# Patient Record
Sex: Female | Born: 1955 | Hispanic: No | Marital: Married
Health system: Western US, Academic
[De-identification: ages and names within clinical notes are randomized; demographics above are authoritative.]

## PROBLEM LIST (undated history)

## (undated) DIAGNOSIS — J310 Chronic rhinitis: Secondary | ICD-10-CM

## (undated) DIAGNOSIS — J329 Chronic sinusitis, unspecified: Secondary | ICD-10-CM

## (undated) DIAGNOSIS — J45909 Unspecified asthma, uncomplicated: Secondary | ICD-10-CM

## (undated) DIAGNOSIS — Z8709 Personal history of other diseases of the respiratory system: Secondary | ICD-10-CM

## (undated) HISTORY — PX: CATARACT EXTRACTION: SHX1313B

## (undated) HISTORY — DX: Chronic rhinitis: J31.0

## (undated) HISTORY — DX: Chronic sinusitis, unspecified: J32.9

## (undated) HISTORY — DX: Personal history of other diseases of the respiratory system: Z87.09

## (undated) HISTORY — DX: Unspecified asthma, uncomplicated: J45.909

---

## 2000-12-15 ENCOUNTER — Emergency Department (HOSPITAL_COMMUNITY): Admission: EM | Admit: 2000-12-15 | Discharge: 2000-12-15 | Payer: Self-pay | Admitting: *Deleted

## 2001-01-05 ENCOUNTER — Emergency Department (HOSPITAL_COMMUNITY): Admission: EM | Admit: 2001-01-05 | Discharge: 2001-01-05 | Payer: Self-pay | Admitting: Emergency Medicine

## 2002-08-20 ENCOUNTER — Ambulatory Visit (HOSPITAL_COMMUNITY): Admission: RE | Admit: 2002-08-20 | Discharge: 2002-08-20 | Payer: Self-pay | Admitting: Family Medicine

## 2002-08-20 ENCOUNTER — Encounter: Payer: Self-pay | Admitting: Family Medicine

## 2002-09-12 ENCOUNTER — Encounter: Payer: Self-pay | Admitting: Emergency Medicine

## 2002-09-12 ENCOUNTER — Inpatient Hospital Stay (HOSPITAL_COMMUNITY): Admission: EM | Admit: 2002-09-12 | Discharge: 2002-09-18 | Payer: Self-pay | Admitting: Emergency Medicine

## 2002-09-13 ENCOUNTER — Encounter: Payer: Self-pay | Admitting: Emergency Medicine

## 2002-09-13 ENCOUNTER — Encounter: Payer: Self-pay | Admitting: Critical Care Medicine

## 2002-09-14 ENCOUNTER — Encounter: Payer: Self-pay | Admitting: Critical Care Medicine

## 2002-09-15 ENCOUNTER — Encounter: Payer: Self-pay | Admitting: Critical Care Medicine

## 2002-09-16 ENCOUNTER — Encounter: Payer: Self-pay | Admitting: Critical Care Medicine

## 2004-10-04 ENCOUNTER — Encounter: Admission: RE | Admit: 2004-10-04 | Discharge: 2004-10-04 | Payer: Self-pay | Admitting: Specialist

## 2005-02-08 ENCOUNTER — Ambulatory Visit: Payer: Self-pay | Admitting: Internal Medicine

## 2005-07-03 ENCOUNTER — Ambulatory Visit: Payer: Self-pay | Admitting: Internal Medicine

## 2005-12-12 ENCOUNTER — Ambulatory Visit: Payer: Self-pay | Admitting: Internal Medicine

## 2006-05-07 ENCOUNTER — Ambulatory Visit: Payer: Self-pay | Admitting: Internal Medicine

## 2006-08-08 ENCOUNTER — Ambulatory Visit: Payer: Self-pay | Admitting: Internal Medicine

## 2006-09-19 ENCOUNTER — Ambulatory Visit: Payer: Self-pay | Admitting: Internal Medicine

## 2007-03-13 ENCOUNTER — Ambulatory Visit: Payer: Self-pay | Admitting: Internal Medicine

## 2008-02-14 DIAGNOSIS — J4541 Moderate persistent asthma with (acute) exacerbation: Secondary | ICD-10-CM | POA: Insufficient documentation

## 2008-02-14 DIAGNOSIS — J302 Other seasonal allergic rhinitis: Secondary | ICD-10-CM

## 2008-02-14 DIAGNOSIS — J3089 Other allergic rhinitis: Secondary | ICD-10-CM

## 2008-02-17 ENCOUNTER — Ambulatory Visit: Payer: Self-pay | Admitting: Internal Medicine

## 2008-02-23 DIAGNOSIS — J45909 Unspecified asthma, uncomplicated: Secondary | ICD-10-CM

## 2009-01-12 ENCOUNTER — Ambulatory Visit: Payer: Self-pay | Admitting: Internal Medicine

## 2009-01-14 ENCOUNTER — Encounter: Payer: Self-pay | Admitting: Internal Medicine

## 2009-01-14 ENCOUNTER — Telehealth (INDEPENDENT_AMBULATORY_CARE_PROVIDER_SITE_OTHER): Payer: Self-pay | Admitting: *Deleted

## 2009-01-31 DIAGNOSIS — J9600 Acute respiratory failure, unspecified whether with hypoxia or hypercapnia: Secondary | ICD-10-CM

## 2009-08-24 ENCOUNTER — Ambulatory Visit: Payer: Self-pay | Admitting: Internal Medicine

## 2009-08-24 DIAGNOSIS — R21 Rash and other nonspecific skin eruption: Secondary | ICD-10-CM | POA: Insufficient documentation

## 2010-11-29 ENCOUNTER — Telehealth: Payer: Self-pay | Admitting: Internal Medicine

## 2010-12-22 ENCOUNTER — Ambulatory Visit (INDEPENDENT_AMBULATORY_CARE_PROVIDER_SITE_OTHER): Payer: BC Managed Care – HMO | Admitting: Internal Medicine

## 2010-12-22 ENCOUNTER — Ambulatory Visit: Admit: 2010-12-22 | Payer: Self-pay | Admitting: Internal Medicine

## 2010-12-22 ENCOUNTER — Encounter: Payer: Self-pay | Admitting: Internal Medicine

## 2010-12-22 DIAGNOSIS — J018 Other acute sinusitis: Secondary | ICD-10-CM

## 2010-12-22 DIAGNOSIS — J9600 Acute respiratory failure, unspecified whether with hypoxia or hypercapnia: Secondary | ICD-10-CM

## 2010-12-22 DIAGNOSIS — J45909 Unspecified asthma, uncomplicated: Secondary | ICD-10-CM

## 2010-12-22 DIAGNOSIS — J309 Allergic rhinitis, unspecified: Secondary | ICD-10-CM

## 2010-12-22 NOTE — Progress Notes (Signed)
Summary: refills  Phone Note From Pharmacy   Caller: [facility] Summary of Call: Pt was at pharmacy stating she was not able to get refills on medication until pharm called our office. Per our records pt needed to call for appointment for refills. Pt is scheduled to see CY Feb 2nd and will need to keep that appointment for additional refills. 1 month supply given on both. Zackery Barefoot CMA  November 29, 2010 12:28 PM     Prescriptions: VENTOLIN HFA 108 (90 BASE) MCG/ACT AERS (ALBUTEROL SULFATE) 2 puffs four times a day as needed  #1 x 0   Entered by:   Zackery Barefoot CMA   Authorized by:   Waymon Budge MD   Signed by:   Zackery Barefoot CMA on 11/29/2010   Method used:   Telephoned to ...       [facility] (retail)       [address]       [place], [place]  [id number]       Ph: [phone]       Fax: [phone]   RxID:   [id number] ADVAIR DISKUS 500-50 MCG/DOSE  MISC (FLUTICASONE-SALMETEROL) take one puff two times a day   rinse mouth after each use  #60 Each x 0   Entered by:   Zackery Barefoot CMA   Authorized by:   Waymon Budge MD   Signed by:   Zackery Barefoot CMA on 11/29/2010   Method used:   Telephoned to ...       [facility] (retail)       [address]       [place], [place]  [id number]       Ph: [phone]       Fax: [phone]   RxID:   [id number]

## 2010-12-28 NOTE — Assessment & Plan Note (Signed)
Summary: rov   Vital Signs:  Patient profile:   55 year old female Height:      64 inches Weight:      152.25 pounds BMI:     26.23 O2 Sat:      97 % on Room air Pulse rate:   87 / minute BP sitting:   120 / 80  (left arm) Cuff size:   regular  Vitals Entered By: Reynaldo Minium CMA (December 22, 2010 2:54 PM)  O2 Flow:  Room air CC: Follow up visit-stopped up nose(worse at night).   Primary Provider/Referring Provider:  None  CC:  Follow up visit-stopped up nose(worse at night)..  History of Present Illness: 02/17/08- asthma, rhinitis She notices a runny nose this spring, but the most important issue is that she is about to leave to visit her family in [place] for 4 months.  She wants to take medications with her and we discussed this.  She thinks her current medications are adequate.  Benadryl makes her sleepy.  She is not now, wheezing, or uncomfortable.   01/12/09- asthmatic bronchitis, rhinitis Was back in [place] for 3 months, returning in September. Was well through winter. Just in last few days has questioned rhinorhea and some fever. Feels for 2 months Advair 500 is not working quite as well as it had. Feels cough and malaise, headache, sinus pressure and congestion.  August 24, 2009- Asthmatic bronchitis, rhinitis, eczema Never bad, but sometimes chest gets tight. Little cough or wheeze.Needs flu shot.  Has been using Advsair 500, with only occasional need for rescue inhaler. Asks refill for med we gave for eczema.  December 22, 2010-  Asthmatic bronchitis, rhinitis, eczema Nurse-CC: Follow up visit-stopped up nose(worse at night). She had gone to [place] and [place], back now 6 months. Having sinus congestion and occasional chest tightness. I can't understand if she really felt any different while in [place]- she didn't seem to notice a difference. Nasal congestion with some mucoid discharge, pressure but not headache, fever, blood, sore throat or ear ache.     Asthma  History    Initial Asthma Severity Rating:    Age range: 12+ years    Symptoms: 0-2 days/week    Nighttime Awakenings: 0-2/month    Interferes w/ normal activity: no limitations    SABA use (not for EIB): 0-2 days/week    Asthma Severity Assessment: Intermittent   Preventive Screening-Counseling & Management  Alcohol-Tobacco     Smoking Status: never  Current Medications (verified): 1)  Advair Diskus 500-50 Mcg/dose  Misc (Fluticasone-Salmeterol) .... Take One Puff Two Times A Day   Rinse Mouth After Each Use 2)  Ventolin Hfa 108 (90 Base) Mcg/act Aers (Albuterol Sulfate) .... 2 Puffs Four Times A Day As Needed  Allergies (verified): No Known Drug Allergies  Past History:  Past Surgical History: Last updated: 01/12/2009 None  Family History: Last updated: 08/24/2009 Unknown- [place]  Social History: Last updated: 01/12/2009 From [place] Patient never smoked.  Married 3 children  Risk Factors: Smoking Status: never (12/22/2010)  Past Medical History: Allergic Rhinitis Rhinosinusitis Asthma Hx of acute respiratory failure/ severe asthma  Review of Systems      See HPI       The patient complains of nasal congestion/difficulty breathing through nose and sneezing.  The patient denies shortness of breath with activity, shortness of breath at rest, productive cough, non-productive cough, coughing up blood, chest pain, irregular heartbeats, acid heartburn, indigestion, loss of appetite, weight change,  abdominal pain, difficulty swallowing, sore throat, tooth/dental problems, and headaches.    Physical Exam  Additional Exam:  General: A/Ox3; pleasant and cooperative, NAD, SKIN: no visble rash, little exposed skin NODES: no lymphadenopathy HEENT: [place]/AT, EOM- WNL, Conjuctivae- clear, PERRLA, TM-WNL, Nose- stuffy with turbinate edema, no polyps or purulence, Throat- clear and wnl, Mallampati  III NECK: Supple w/ fair ROM, JVD- none, normal carotid impulses w/o bruits  Thyroid- CHEST:Clear to P&A, no wheeze HEART: RRR, no m/g/r heard ABDOMEN: Soft and nl;  NWG:NFAO, nl pulses, no edema  NEURO: Grossly intact to observation      Impression & Recommendations:  Problem # 1:  RHINOSINUSITIS, ACUTE (ICD-461.8)  Her main complaint today is nasal congestion, apparently persistent for over a month We will give neb, depo and amox, then suggest PE.   Her updated medication list for this problem includes:    Amoxicillin 500 Mg Tabs (Amoxicillin) .Marland Kitchen... 1 three times a day x 10 days  Problem # 2:  ASTHMA (ICD-493.90) Asthma has been under good control, but she needs meds refilled. There have been no dangerous episodes since her original respiratory evetrn.   Medications Added to Medication List This Visit: 1)  Amoxicillin 500 Mg Tabs (Amoxicillin) .Marland Kitchen.. 1 three times a day x 10 days  Other Orders: Admin of Therapeutic Inj  intramuscular or subcutaneous (13086) Depo- Medrol 80mg  (J1040) Nebulizer Tx (57846) Est. Patient Level III (96295)  Patient Instructions: 1)  Please schedule a follow-up appointment in 1 year or sooner as needed 2)  Neb neo nasal 3)  depo 80 4)  script for amoxacillin antibiotic for infection in sinuses 5)  Script refills for Advair and Ventolin for asthma 6)  For nasal / sinus congestion, you can try Suphedrine-PE otc  Prescriptions: VENTOLIN HFA 108 (90 BASE) MCG/ACT AERS (ALBUTEROL SULFATE) 2 puffs four times a day as needed  #1 x prn   Entered and Authorized by:   Waymon Budge MD   Signed by:   Waymon Budge MD on 12/22/2010   Method used:   Print then Give to Patient   RxID:   [id number] ADVAIR DISKUS 500-50 MCG/DOSE  MISC (FLUTICASONE-SALMETEROL) take one puff two times a day   rinse mouth after each use  #60 Each x prn   Entered and Authorized by:   Waymon Budge MD   Signed by:   Waymon Budge MD on 12/22/2010   Method used:   Print then Give to Patient   RxID:   [id number] AMOXICILLIN 500 MG TABS  (AMOXICILLIN) 1 three times a day x 10 days  #30 x 0   Entered and Authorized by:   Waymon Budge MD   Signed by:   Waymon Budge MD on 12/22/2010   Method used:   Print then Give to Patient   RxID:   [id number]    Medication Administration  Injection # 1:    Medication: Depo- Medrol 80mg     Diagnosis: RHINOSINUSITIS, ACUTE (ICD-461.8)    Route: SQ    Site: RUOQ gluteus    Exp Date: 05/2013    Lot #: [id number]    Mfr: Pharmacia    Patient tolerated injection without complications    Given by: Reynaldo Minium CMA (December 22, 2010 3:48 PM)  Medication # 1:    Medication: EMR miscellaneous medications    Diagnosis: RHINOSINUSITIS, ACUTE (ICD-461.8)    Dose: 3drops    Route: intranasal    Exp Date: 02/2012  Lot #: [id number]    Mfr: bayer    Comments: Neo-synephrine    Patient tolerated medication without complications    Given by: Reynaldo Minium CMA (December 22, 2010 3:48 PM)  Orders Added: 1)  Admin of Therapeutic Inj  intramuscular or subcutaneous [96372] 2)  Depo- Medrol 80mg  [J1040] 3)  Nebulizer Tx [78469] 4)  Est. Patient Level III [62952]

## 2011-04-07 NOTE — Assessment & Plan Note (Signed)
[facility]                               PULMONARY OFFICE NOTE   NAME:Brandy Rose, Rose                          MRN:          [id number]  DATE:09/19/2006                            DOB:          [birth year]    PROBLEM LIST:  1. Asthmatic bronchitis.  2. Allergic rhinitis.  3. History of intubation for respiratory failure.   HISTORY:  She says this is a good day and that she feels well.  Singulair  caused headache and she stopped it.   MEDICATIONS:  1. Advair 500/50.  2. Albuterol rescue inhaler used occasionally.   NO MEDICATION ALLERGY.   OBJECTIVE:  VITAL SIGNS:  Weight 149 pounds, BP 126/62, pulse regular 89,  room air saturation 98%.  GENERAL:  She sounds slightly nasal.  CHEST:  Quiet and clear.  HEART:  Sounds are regular without murmur.  There is no adenopathy.  No  cyanosis or edema.   PFT:  Spirometry shows severe obstructive airway disease with an FEV1 of  1.26 (56% predicted), FVC 75% of predicted, ratio 0.56, small airway flows  respond some to bronchodilator.  Chest x-ray, on August 08, 2006, had  questioned a small nodule at the left lung base suggesting followup chest x-  ray.  An immunoglobulin E was 30.9 on August 09, 2006, with 7.6%  eosinophils on her differential.   IMPRESSION:  Chronic asthma/obstructive airway disease with probable ongoing  allergic component.   PLAN:  1. Chest x-ray to followup questionable lung nodule.  2. Flu vaccine discussed and given.  3. Schedule return 4 months, earlier p.r.n.  4. Environmental precautions were discussed.    Clinton D. Maple Hudson, MD, Tonny Bollman, FACP  Electronically Signed   CDY/MedQ  DD: 09/21/2006  DT: 09/22/2006  Job #: [id number]   cc:   [facility]

## 2011-04-07 NOTE — Discharge Summary (Signed)
Brandy Rose, Brandy Rose                             ACCOUNT NO.:  [id number]   MEDICAL RECORD NO.:  [id number]                   PATIENT TYPE:  INP   LOCATION:  5530                                 FACILITY:  [facility]   PHYSICIAN:  Oley Balm. Sung Amabile, M.D. [facility]          DATE OF BIRTH:  25-Mar-1956   DATE OF ADMISSION:  09/12/2002  DATE OF DISCHARGE:  09/18/2002                                 DISCHARGE SUMMARY   ADMISSION DIAGNOSES:  1. Ventilator-dependent respiratory failure.  2. Status asthmaticus.   DISCHARGE DIAGNOSES:  1. Ventilator-dependent respiratory failure, resolved.  2. Status asthmaticus, resolved.  3. Poorly controlled asthma due to lack of access to medical care.  The     patient was not on a controller medication prior to admission; in     addition, she has significant exposure to dust and cleaning supplies at     her place of employment.   HISTORY OF PRESENT ILLNESS:  This is a 55 year old woman who recently moved  to the [place] from [place].  She presented with one to two days of  increased shortness of breath and wheezing.  She was using albuterol almost  hourly without improvement in her symptoms.  Upon arrival to the emergency  department, she was noted to be in respiratory distress.  She underwent  intubation in the emergency department and was transferred to the intensive  care unit.   HOSPITAL COURSE:  She was treated in the usual fashion with nebulized  bronchodilators and intravenous corticosteroids; in addition, she was  treated with paralytics, magnesium and ceftriaxone.  Records indicate that  this was a very difficult intubation and she was intubated with a #6 tube.  With the above intervention, she improved dramatically and by September 15, 2002, she was felt ready for extubation.  Due to the difficulty of  intubation, we had anesthesia on standby during the extubation process.  She  did have some mild stridor and pseudo-wheeze immediately post  extubation but  never had respiratory distress.  Antibiotics were stopped on September 08, 2002 in the absence of any evidence of an infectious process.  Steroids were  tapered beginning on September 15, 2002.  She was transferred out of the  intensive care unit on September 16, 2002 and the rest of her hospitalization  was uncomplicated.  She did undergo asthma teaching and was taught how to  use the Advair Diskus and metered-dose inhalers properly.  She is discharged  now in markedly improved condition.   DISCHARGE MEDICATIONS:  1. Advair 250/50 mcg one inhalation q.12h.  2. Albuterol metered-dose inhaler two sprays q.4h. p.r.n.  3. Prednisone 60 mg x3, 40 mg x3, 20 mg x3, stop.    FOLLOWUP:  She is scheduled to see Dr. Onalee Hua B. Simonds in the office on  October 22, 2002 at 9 a.m.  It is also of note that she is followed  at  [facility] for her general medical care.                                               Oley Balm Sung Amabile, M.D. [facility]    DBS/MEDQ  D:  09/18/2002  T:  09/19/2002  Job:  [id number]   cc:   Dala Dock

## 2011-04-07 NOTE — Assessment & Plan Note (Signed)
[facility]                             PULMONARY OFFICE NOTE   NAME:Brandy Rose, Brandy Rose                          MRN:          [id number]  DATE:03/13/2007                            DOB:          Jul 11, 1956    HISTORY OF PRESENT ILLNESS:  The patient is a 55 year old female patient  of Dr. Maple Hudson who has a known history of chronic asthmatic bronchitis and  allergic rhinitis who presents today for a 2-day history of congestion,  wheezing, and tightness.  The patient reports that she had to increase  her albuterol use over the last 24 hours.  The patient denies any  purulent sputum, fever, chest pain, palpitations, neck or arm pain, or  radiating pains.  The patient is taking Advair 500/50 twice daily.   PAST MEDICAL HISTORY:  Reviewed.   CURRENT MEDICATIONS:  Reviewed.   PHYSICAL EXAM:  The patient is a pleasant female in no acute distress.  She is afebrile with stable vital signs.  O2 saturation is 97% on room  air.  HEENT:  Unremarkable.  NECK:  Supple without adenopathy.  No JVD.  LUNGS:  Sounds are clear without any wheezing or crackles.  CARDIAC:  Regular rate and rhythm.  ABDOMEN:  Soft and nontender.  EXTREMITIES:  Warm without any calf cyanosis, clubbing, or edema.   IMPRESSION AND PLAN:  Mild asthmatic bronchitic flare.  The patient is  given a Xopenex nebulizer treatment in the office.  The patient is  recommended to add in Mucinex DM twice daily and claritin 10 mg daily.  The patient was offered a prednisone taper to have on hold.  However,  the patient reports that steroids make her very jittery and we will hold  off at this time.  The patient is advised if symptoms do not improve or  do worsen, she is to call us immediately for followup.      Rubye Oaks, NP  Electronically Signed      Clinton D. Maple Hudson, MD, Tonny Bollman, FACP  Electronically Signed   TP/MedQ  DD: 03/13/2007  DT: 03/13/2007  Job #: [id number]

## 2011-04-07 NOTE — Assessment & Plan Note (Signed)
[facility]                               PULMONARY OFFICE NOTE   NAME:Brandy Rose, Brandy Rose                          MRN:          [id number]  DATE:08/08/2006                            DOB:          12/07/1955    PROBLEM:  1. Asthmatic bronchitis.  2. Allergic rhinitis.  3. History of intubation for respiratory failure.   HISTORY:  She says cold air has made her tighter, but she does not feel  sick.  I tried to help her with her English a little bit, discussing what we  mean by a cold.  She denies fever, sore throat or discolored sputum, but  has been fairly tight for the last several days  Theophylline was tried  twice and does not seem to have helped any.  Singulair did not help.  She  continues Advair 500/50 and I believe uses it appropriately.  She was skin-  test-positive in the past, but did not stay on allergy vaccine long enough  to be helpful in 2004.   OBJECTIVE:  Weight 149 pounds.  BP 116/70.  Pulse regular at 79.  Room air  saturation 96%.  She is unlabored, but there is distinct bilateral inspiratory and expiratory  wheeze with no dullness or rhonchi, no accessory muscle use.  Heart sounds are regular without murmur or gallop.  There is no neck vein distention, peripheral edema, cyanosis or adenopathy.   IMPRESSION:  Exacerbation of chronic asthma.  Finances and language  interfere a little, but we seem to be able to get around the language  barrier.  I wonder if she might be a candidate for Xolair.   PLAN:  Blood today for CBC with differential and IgE level.  Chest x-ray.  Schedule spirometry before and after bronchodilator.  Leave of absence for 3  days, September 20, 21 and 22.  Prednisone 8-day taper from 40 mg with  steroid talk again done, questions answered.  Scheduled return in 6 weeks.                                   Clinton D. Maple Hudson, MD, FCCP, FACP   CDY/MedQ  DD:  08/08/2006  DT:  08/09/2006  Job #:  [id number]   cc:    [employer]

## 2011-04-14 ENCOUNTER — Other Ambulatory Visit: Payer: Self-pay | Admitting: Internal Medicine

## 2011-04-19 ENCOUNTER — Telehealth: Payer: Self-pay | Admitting: Internal Medicine

## 2011-04-19 MED ORDER — FLUTICASONE-SALMETEROL 500-50 MCG/DOSE IN AEPB: 1.0000 | INHALATION_SPRAY | Freq: Two times a day (BID) | RESPIRATORY_TRACT | Status: DC

## 2011-04-19 NOTE — Telephone Encounter (Signed)
Refill sent. Pt aware.Brandy Rose, CMA  

## 2011-04-20 ENCOUNTER — Telehealth: Payer: Self-pay | Admitting: Internal Medicine

## 2011-04-20 NOTE — Telephone Encounter (Signed)
Per pt's records, this was already called in yesterday to the [facility] per pt's request.  Now she is requesting it be called into [facility]?!?!?!  ATC and speak with pt.  Family member stated she was not avail and was unable to take a message as she spoke very little [place]. Therefore WCB.

## 2011-04-21 NOTE — Telephone Encounter (Signed)
ATC pt at home #.  Line busy x 3.  WCB 

## 2011-04-25 NOTE — Telephone Encounter (Signed)
LMTCB

## 2011-04-26 NOTE — Telephone Encounter (Signed)
ATC pt at home # x 3.  Line busy.  WCB

## 2011-04-27 NOTE — Telephone Encounter (Signed)
Spoke with lady at home number-states pt not home and will have her call back in the morning.

## 2011-04-28 NOTE — Telephone Encounter (Signed)
ATC pt at home #.  NA and unable to leave a message. WCB

## 2011-05-01 NOTE — Telephone Encounter (Signed)
Spoke with pt and she states has already picked up her advair and nothing further needed.

## 2011-11-10 ENCOUNTER — Other Ambulatory Visit: Payer: Self-pay | Admitting: Internal Medicine

## 2011-12-25 ENCOUNTER — Encounter: Payer: Self-pay | Admitting: Pulmonary Disease

## 2011-12-26 ENCOUNTER — Ambulatory Visit: Payer: BC Managed Care – HMO | Admitting: Internal Medicine

## 2012-01-18 ENCOUNTER — Telehealth: Payer: Self-pay | Admitting: Internal Medicine

## 2012-01-18 NOTE — Telephone Encounter (Signed)
Form and message on CY's cart to complete.

## 2012-01-18 NOTE — Telephone Encounter (Signed)
NOTE: I HAVE GIVEN THIS TO KATIE. Brandy Rose

## 2012-01-19 ENCOUNTER — Encounter: Payer: Self-pay | Admitting: Internal Medicine

## 2012-01-22 ENCOUNTER — Encounter: Payer: Self-pay | Admitting: Internal Medicine

## 2012-01-22 ENCOUNTER — Ambulatory Visit (INDEPENDENT_AMBULATORY_CARE_PROVIDER_SITE_OTHER): Payer: BC Managed Care – HMO | Admitting: Internal Medicine

## 2012-01-22 VITALS — BP 120/90 | HR 90 | Ht 64.0 in | Wt 152.6 lb

## 2012-01-22 DIAGNOSIS — J45909 Unspecified asthma, uncomplicated: Secondary | ICD-10-CM

## 2012-01-22 DIAGNOSIS — J309 Allergic rhinitis, unspecified: Secondary | ICD-10-CM

## 2012-01-22 DIAGNOSIS — J9600 Acute respiratory failure, unspecified whether with hypoxia or hypercapnia: Secondary | ICD-10-CM

## 2012-01-22 MED ORDER — ALBUTEROL SULFATE HFA 108 (90 BASE) MCG/ACT IN AERS: 2.0000 | INHALATION_SPRAY | RESPIRATORY_TRACT | Status: DC | PRN

## 2012-01-22 MED ORDER — FLUTICASONE-SALMETEROL 250-50 MCG/DOSE IN AEPB: 1.0000 | INHALATION_SPRAY | Freq: Two times a day (BID) | RESPIRATORY_TRACT | Status: DC

## 2012-01-22 NOTE — Progress Notes (Signed)
01/22/12- 56 yoF from [place], followed for asthmatic bronchitis, allergic rhinitis. History of ventilator dependent respiratory failure. LOV- 12/22/2010 Was in [place] last month. Here now with youngest daughter. Reports occasional mild wheezing, easily controlled and mostly associated with colds. Occasional rhinitis is also better controlled. Has been using Benadryl as antihistamine of choice but it does cause sleepiness.  ROS-see HPI Constitutional:   No-   weight loss, night sweats, fevers, chills, fatigue, lassitude. HEENT:   No-  headaches, difficulty swallowing, tooth/dental problems, sore throat,       No-  sneezing, itching, ear ache, nasal congestion, post nasal drip,  CV:  No-   chest pain, orthopnea, PND, swelling in lower extremities, anasarca, dizziness, palpitations Resp: No-   shortness of breath with exertion or at rest.              No-   productive cough,  No non-productive cough,  No- coughing up of blood.              No-   change in color of mucus.  No- wheezing.   Skin: No-   rash or lesions. GI:  No-   heartburn, indigestion, abdominal pain, nausea,  GU:. MS:  No-   joint pain or swelling.  No- decreased range of motion.  No- back pain. Neuro-     nothing unusual Psych:  No- change in mood or affect. No depression or anxiety.  No memory loss.  OBJ- Physical Exam General- Alert, Oriented, Affect-appropriate, Distress- none acute Skin- rash-none, lesions- none, excoriation- none Lymphadenopathy- none Head- atraumatic            Eyes- Gross vision intact, PERRLA, conjunctivae and secretions clear            Ears- Hearing, canals-normal            Nose- Clear, no-Septal dev, mucus, polyps, erosion, perforation             Throat- Mallampati II , mucosa clear , drainage- none, tonsils- atrophic Neck- flexible , trachea midline, no stridor , thyroid nl, carotid no bruit Chest - symmetrical excursion , unlabored           Heart/CV- RRR , no murmur , no gallop  , no rub, nl s1  s2                           - JVD- none , edema- none, stasis changes- none, varices- none           Lung- clear to P&A, wheeze- none, cough- none , dullness-none, rub- none           Chest wall-  Abd- Br/ Gen/ Rectal- Not done, not indicated Extrem- cyanosis- none, clubbing, none, atrophy- none, strength- nl Neuro- grossly intact to observation

## 2012-01-22 NOTE — Telephone Encounter (Signed)
Information filled out and given to patient today by CY.

## 2012-01-22 NOTE — Patient Instructions (Signed)
Scripts for Advair and for albuterol rescue inhaler. You can give these to the pharmacy now and ask them to put them on hold until you need your medicine.  Benadryl is ok as an antihistamine. If it makes you too sleepy, try otc Claritin/ loratadine.

## 2012-01-26 NOTE — Assessment & Plan Note (Signed)
Good control. Discussed alternatives to Benadryl.

## 2012-01-26 NOTE — Assessment & Plan Note (Signed)
Exacerbations usually related to viral infections. She has been using Advair 500. Plan reduce Advair to 250 with discussion.

## 2012-01-26 NOTE — Assessment & Plan Note (Signed)
This was due to untreated asthma now well controlled.

## 2013-03-14 ENCOUNTER — Other Ambulatory Visit: Payer: Self-pay | Admitting: Internal Medicine

## 2013-03-18 ENCOUNTER — Other Ambulatory Visit: Payer: Self-pay | Admitting: Internal Medicine

## 2013-07-04 ENCOUNTER — Other Ambulatory Visit: Payer: Self-pay | Admitting: Internal Medicine

## 2013-07-07 ENCOUNTER — Telehealth: Payer: Self-pay | Admitting: Internal Medicine

## 2013-07-07 MED ORDER — FLUTICASONE-SALMETEROL 250-50 MCG/DOSE IN AEPB: 1.0000 | INHALATION_SPRAY | Freq: Two times a day (BID) | RESPIRATORY_TRACT | Status: DC

## 2013-07-07 NOTE — Telephone Encounter (Signed)
Rx has been sent in. Pt has upcoming appointment 07/2013. Advised that she needs to keep this for further refills.

## 2013-07-31 ENCOUNTER — Ambulatory Visit: Payer: BC Managed Care – HMO | Admitting: Internal Medicine

## 2013-08-14 ENCOUNTER — Ambulatory Visit: Payer: BC Managed Care – HMO | Admitting: Internal Medicine

## 2013-08-25 ENCOUNTER — Other Ambulatory Visit: Payer: Self-pay | Admitting: Internal Medicine

## 2013-08-26 ENCOUNTER — Telehealth: Payer: Self-pay | Admitting: Internal Medicine

## 2013-08-26 MED ORDER — FLUTICASONE-SALMETEROL 250-50 MCG/DOSE IN AEPB: 1.0000 | INHALATION_SPRAY | Freq: Two times a day (BID) | RESPIRATORY_TRACT | Status: DC

## 2013-08-26 NOTE — Telephone Encounter (Signed)
I spoke with pt and made her aware rx has been sent and needs to keep pending appt. Nothing further needed

## 2013-09-24 ENCOUNTER — Ambulatory Visit (INDEPENDENT_AMBULATORY_CARE_PROVIDER_SITE_OTHER): Payer: BC Managed Care – HMO | Admitting: Internal Medicine

## 2013-09-24 ENCOUNTER — Encounter: Payer: Self-pay | Admitting: Internal Medicine

## 2013-09-24 VITALS — BP 118/86 | HR 107 | Ht 64.0 in | Wt 160.0 lb

## 2013-09-24 DIAGNOSIS — J4520 Mild intermittent asthma, uncomplicated: Secondary | ICD-10-CM

## 2013-09-24 DIAGNOSIS — J302 Other seasonal allergic rhinitis: Secondary | ICD-10-CM

## 2013-09-24 DIAGNOSIS — J309 Allergic rhinitis, unspecified: Secondary | ICD-10-CM

## 2013-09-24 DIAGNOSIS — J45909 Unspecified asthma, uncomplicated: Secondary | ICD-10-CM

## 2013-09-24 MED ORDER — FLUTICASONE-SALMETEROL 250-50 MCG/DOSE IN AEPB: 1.0000 | INHALATION_SPRAY | Freq: Two times a day (BID) | RESPIRATORY_TRACT | Status: DC

## 2013-09-24 MED ORDER — ALBUTEROL SULFATE HFA 108 (90 BASE) MCG/ACT IN AERS: 2.0000 | INHALATION_SPRAY | RESPIRATORY_TRACT | Status: AC | PRN

## 2013-09-24 NOTE — Progress Notes (Signed)
01/22/12- 56 yoF from [place], followed for asthmatic bronchitis, allergic rhinitis. History of ventilator dependent respiratory failure. LOV- 12/22/2010 Was in [place] last month. Here now with youngest daughter. Reports occasional mild wheezing, easily controlled and mostly associated with colds. Occasional rhinitis is also better controlled. Has been using Benadryl as antihistamine of choice but it does cause sleepiness.  09/24/13-  57 yoF from [place], followed for asthmatic bronchitis, allergic rhinitis. History of ventilator dependent respiratory failure. FOLLOWS FOR: Breathing is unchanged. Reports chest tightness is improved. Having issues with runny nose and congestion. Watery eyes and nose especially in the evening, starting before she lies down. 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 Benadryl makes her too sleepy, using cetirizine. Daughter is a marine, husband is an Tree surgeon.  ROS-see HPI Constitutional:   No-   weight loss, night sweats, fevers, chills, fatigue, lassitude. HEENT:   No-  headaches, difficulty swallowing, tooth/dental problems, sore throat,       No-  sneezing, itching, ear ache, nasal congestion, post nasal drip,  CV:  No-   chest pain, orthopnea, PND, swelling in lower extremities, anasarca, dizziness, palpitations Resp: No-   shortness of breath with exertion or at rest.              No-   productive cough,  No non-productive cough,  No- coughing up of blood.              No-   change in color of mucus.  No- wheezing.   Skin: No-   rash or lesions. GI:  No-   heartburn, indigestion, abdominal pain, nausea,  GU:. MS:  No-   joint pain or swelling.  No- decreased range of motion.  No- back pain. Neuro-     nothing unusual Psych:  No- change in mood or affect. No depression or anxiety.  No memory loss.  OBJ- Physical Exam General- Alert, Oriented, Affect-appropriate, Distress- none acute Skin- rash-none, lesions- none, excoriation- none Lymphadenopathy- none Head- atraumatic         Eyes- Gross vision intact, PERRLA, conjunctivae and secretions clear            Ears- Hearing, canals-normal            Nose- Clear, no-Septal dev, mucus, polyps, erosion, perforation             Throat- Mallampati II , mucosa clear , drainage- none, tonsils- atrophic Neck- flexible , trachea midline, no stridor , thyroid nl, carotid no bruit Chest - symmetrical excursion , unlabored           Heart/CV- RRR , no murmur , no gallop  , no rub, nl s1 s2                           - JVD- none , edema- none, stasis changes- none, varices- none           Lung- clear to P&A, wheeze- none, cough- none , dullness-none, rub- none           Chest wall-  Abd- Br/ Gen/ Rectal- Not done, not indicated Extrem- cyanosis- none, clubbing, none, atrophy- none, strength- nl Neuro- grossly intact to observation

## 2013-09-24 NOTE — Patient Instructions (Signed)
Prescriptions sent to [facility] for Advair and albuterol rescue inhaler  Sample Dymista nasal spray-  Try 1-2 puffs, once every day at bedtime    See if this helps with your watery nose  Flu vax  Please call as needed

## 2013-10-01 ENCOUNTER — Encounter: Payer: Self-pay | Admitting: Internal Medicine

## 2013-10-01 NOTE — Assessment & Plan Note (Signed)
Chronic rhinitis with conjunctivitis. Plan-try Dymista nasal spray

## 2013-10-01 NOTE — Assessment & Plan Note (Signed)
Plan-flu vaccine, refill Advair and rescue inhaler with medication talk

## 2013-12-12 ENCOUNTER — Encounter (HOSPITAL_COMMUNITY): Payer: Self-pay | Admitting: Emergency Medicine

## 2013-12-12 ENCOUNTER — Emergency Department (HOSPITAL_COMMUNITY): Payer: BC Managed Care – HMO

## 2013-12-12 ENCOUNTER — Emergency Department (HOSPITAL_COMMUNITY)
Admission: EM | Admit: 2013-12-12 | Discharge: 2013-12-12 | Disposition: A | Discharge status: Home / Self Care | Payer: BC Managed Care – HMO | Attending: Emergency Medicine | Admitting: Emergency Medicine

## 2013-12-12 DIAGNOSIS — R109 Unspecified abdominal pain: Secondary | ICD-10-CM | POA: Insufficient documentation

## 2013-12-12 DIAGNOSIS — R000 Tachycardia, unspecified: Secondary | ICD-10-CM | POA: Insufficient documentation

## 2013-12-12 DIAGNOSIS — J329 Chronic sinusitis, unspecified: Secondary | ICD-10-CM | POA: Insufficient documentation

## 2013-12-12 DIAGNOSIS — M549 Dorsalgia, unspecified: Secondary | ICD-10-CM | POA: Insufficient documentation

## 2013-12-12 DIAGNOSIS — R51 Headache: Secondary | ICD-10-CM | POA: Insufficient documentation

## 2013-12-12 DIAGNOSIS — Z8709 Personal history of other diseases of the respiratory system: Secondary | ICD-10-CM | POA: Insufficient documentation

## 2013-12-12 DIAGNOSIS — J309 Allergic rhinitis, unspecified: Secondary | ICD-10-CM | POA: Insufficient documentation

## 2013-12-12 DIAGNOSIS — IMO0002 Reserved for concepts with insufficient information to code with codable children: Secondary | ICD-10-CM | POA: Insufficient documentation

## 2013-12-12 DIAGNOSIS — J45909 Unspecified asthma, uncomplicated: Secondary | ICD-10-CM | POA: Insufficient documentation

## 2013-12-12 LAB — CBC WITH DIFFERENTIAL/PLATELET
BASOS ABS: 0 10*3/uL (ref 0.0–0.1)
BASOS PCT: 1 % (ref 0–1)
Eosinophils Absolute: 0.5 10*3/uL (ref 0.0–0.7)
Eosinophils Relative: 7 % — ABNORMAL HIGH (ref 0–5)
HCT: 44.7 % (ref 36.0–46.0)
Hemoglobin: 15.3 g/dL — ABNORMAL HIGH (ref 12.0–15.0)
LYMPHS ABS: 2.9 10*3/uL (ref 0.7–4.0)
Lymphocytes Relative: 39 % (ref 12–46)
MCH: 29.1 pg (ref 26.0–34.0)
MCHC: 34.2 g/dL (ref 30.0–36.0)
MCV: 85 fL (ref 78.0–100.0)
Monocytes Absolute: 0.5 10*3/uL (ref 0.1–1.0)
Monocytes Relative: 6 % (ref 3–12)
NEUTROS PCT: 47 % (ref 43–77)
Neutro Abs: 3.5 10*3/uL (ref 1.7–7.7)
Platelets: 306 10*3/uL (ref 150–400)
RBC: 5.26 MIL/uL — AB (ref 3.87–5.11)
RDW: 13 % (ref 11.5–15.5)
WBC: 7.5 10*3/uL (ref 4.0–10.5)

## 2013-12-12 LAB — COMPREHENSIVE METABOLIC PANEL
ALBUMIN: 3.8 g/dL (ref 3.5–5.2)
ALK PHOS: 68 U/L (ref 39–117)
ALT: 13 U/L (ref 0–35)
AST: 17 U/L (ref 0–37)
BUN: 14 mg/dL (ref 6–23)
CALCIUM: 9.7 mg/dL (ref 8.4–10.5)
CO2: 26 mEq/L (ref 19–32)
Chloride: 99 mEq/L (ref 96–112)
Creatinine, Ser: 0.77 mg/dL (ref 0.50–1.10)
GFR calc Af Amer: >90 mL/min (ref >90)
GFR calc non Af Amer: >90 mL/min (ref >90)
GLUCOSE: 151 mg/dL — AB (ref 70–99)
POTASSIUM: 4.3 meq/L (ref 3.7–5.3)
SODIUM: 140 meq/L (ref 137–147)
Total Bilirubin: <0.2 mg/dL — ABNORMAL LOW (ref 0.3–1.2)
Total Protein: 7.9 g/dL (ref 6.0–8.3)

## 2013-12-12 LAB — URINALYSIS, ROUTINE W REFLEX MICROSCOPIC
Bilirubin Urine: NEGATIVE
Glucose, UA: NEGATIVE mg/dL
HGB URINE DIPSTICK: NEGATIVE
KETONES UR: NEGATIVE mg/dL
NITRITE: NEGATIVE
PH: 5.5 (ref 5.0–8.0)
Protein, ur: NEGATIVE mg/dL
SPECIFIC GRAVITY, URINE: 1.024 (ref 1.005–1.030)
Urobilinogen, UA: 0.2 mg/dL (ref 0.0–1.0)

## 2013-12-12 LAB — URINE MICROSCOPIC-ADD ON

## 2013-12-12 MED ORDER — HYDROCODONE-ACETAMINOPHEN 5-325 MG PO TABS: 1.0000 | ORAL_TABLET | Freq: Four times a day (QID) | ORAL | Status: DC | PRN

## 2013-12-12 MED ORDER — HYDROCODONE-ACETAMINOPHEN 5-325 MG PO TABS
1.0000 | ORAL_TABLET | Freq: Once | ORAL | Status: AC
  Administered 2013-12-12: 1 via ORAL
  Filled 2013-12-12: qty 1

## 2013-12-12 NOTE — ED Provider Notes (Signed)
CSN: [id number]     Arrival date & time 12/12/13  1707 History   First MD Initiated Contact with Patient 12/12/13 1817     Chief Complaint  Patient presents with  . Abdominal Pain  . Headache   (Consider location/radiation/quality/duration/timing/severity/associated sxs/prior Treatment) Patient is a 58 y.o. female presenting with abdominal pain and headaches.  Abdominal Pain Headache Associated symptoms: abdominal pain    58 yo female with new onset of back pain that started 3 days ago. Patient states she also has some lower abdominal pain that is sharp and burning in nature. Currently rated 5-6/10 and is intermittent. Patient states it sometimes radiates to bilateral legs. Patient states the pain feels like "bone pain" and states it is worse with cold weather. Patient denies Lower extremity weakness or numbness. Patient denies bowel/bladder incontinence. Denies any urinary symptoms. Denies any hx of kidney stones. Denies rash. Patient does admit to mild HA that developed while in the waiting room. Denies any heartburn/reflux, N/V/D/C, fever/chills, vaginal pain/bleeding/discharge, night sweats, weight loss, or fatigue.  Patient is never smoker, denies drug or alcohol use. No hx of prior surgeries.  Past Medical History  Diagnosis Date  . Allergic rhinitis   . Rhinosinusitis   . Asthma   . History of acute respiratory failure   . Severe asthma    History reviewed. No pertinent past surgical history. History reviewed. No pertinent family history. History  Substance Use Topics  . Smoking status: Never Smoker   . Smokeless tobacco: Not on file  . Alcohol Use: No   OB History   Grav Para Term Preterm Abortions TAB SAB Ect Mult Living                 Review of Systems  Gastrointestinal: Positive for abdominal pain.  Neurological: Positive for headaches.  All other systems reviewed and are negative.    Allergies  Review of patient's allergies indicates no known  allergies.  Home Medications   Current Outpatient Rx  Name  Route  Sig  Dispense  Refill  . aspirin 81 MG tablet   Oral   Take 81 mg by mouth daily as needed for pain.         Marland Kitchen Fluticasone-Salmeterol (ADVAIR DISKUS) 250-50 MCG/DOSE AEPB   Inhalation   Inhale 1 puff into the lungs 2 (two) times daily.   60 each   prn     MUST KEEP UPCOMING OV   . albuterol (VENTOLIN HFA) 108 (90 BASE) MCG/ACT inhaler   Inhalation   Inhale 2 puffs into the lungs every 4 (four) hours as needed for wheezing or shortness of breath.   1 Inhaler   prn   . HYDROcodone-acetaminophen (NORCO) 5-325 MG per tablet   Oral   Take 1 tablet by mouth every 6 (six) hours as needed for moderate pain or severe pain.   15 tablet   0    BP 138/84  Pulse 93  Temp(Src) 98.5 F (36.9 C) (Oral)  Resp 18  Wt 150 lb (68.04 kg)  SpO2 97% Physical Exam  Nursing note and vitals reviewed. Constitutional: She is oriented to person, place, and time. She appears well-developed and well-nourished. No distress.  HENT:  Head: Normocephalic and atraumatic.  Eyes: Conjunctivae and EOM are normal. No scleral icterus.  Cardiovascular: Regular rhythm.  Tachycardia present.  Exam reveals no gallop and no friction rub.   No murmur heard. Pulmonary/Chest: Effort normal and breath sounds normal. No respiratory distress. She has no wheezes.  She has no rales.  Abdominal: Soft. Bowel sounds are normal. She exhibits no distension and no mass. There is no tenderness. There is no rebound and no guarding.  Musculoskeletal: Normal range of motion. She exhibits no edema and no tenderness.  Neurological: She is alert and oriented to person, place, and time.  Skin: Skin is warm and dry. No rash noted. She is not diaphoretic.  Psychiatric: She has a normal mood and affect. Her behavior is normal.    ED Course  Procedures (including critical care time) Labs Review Labs Reviewed  CBC WITH DIFFERENTIAL - Abnormal; Notable for the  following:    RBC 5.26 (*)    Hemoglobin 15.3 (*)    Eosinophils Relative 7 (*)    All other components within normal limits  COMPREHENSIVE METABOLIC PANEL - Abnormal; Notable for the following:    Glucose, Bld 151 (*)    Total Bilirubin <0.2 (*)    All other components within normal limits  URINALYSIS, ROUTINE W REFLEX MICROSCOPIC - Abnormal; Notable for the following:    Leukocytes, UA SMALL (*)    All other components within normal limits  URINE MICROSCOPIC-ADD ON - Abnormal; Notable for the following:    Squamous Epithelial / LPF FEW (*)    Crystals CA OXALATE CRYSTALS (*)    All other components within normal limits   Imaging Review Ct Abdomen Pelvis Wo Contrast  12/12/2013   CLINICAL DATA:  Abdominal pain and back pain  EXAM: CT ABDOMEN AND PELVIS WITHOUT CONTRAST  TECHNIQUE: Multidetector CT imaging of the abdomen and pelvis was performed following the standard protocol without intravenous contrast.  COMPARISON:  None  FINDINGS: The lung bases are clear. No pleural or pericardial effusion identified.  There is no focal liver abnormality identified. The gallbladder appears normal. No biliary dilatation. Normal appearance of the pancreas. The spleen is unremarkable.  The adrenal glands both appear normal. The right kidney is normal. 2.2 cm left renal cyst is incompletely characterized without IV contrast. The urinary bladder appears normal. Large calcified fibroid is identified within the posterior myometrium measuring approximately 3.5 cm.  Normal caliber of the abdominal aorta. No aneurysm. No upper abdominal adenopathy. There is no pelvic or inguinal adenopathy noted.  The stomach is normal. The small bowel loops have a normal course and caliber without evidence for obstruction. The appendix is visualized and appears normal. The colon is on unremarkable.  There is no ascites identified. No focal fluid collections identified.  IMPRESSION: 1. No acute findings within the abdomen or pelvis to  explain the patient's pain.   Electronically Signed   By: Signa Kellaylor  Stroud M.D.   On: 12/12/2013 21:23    EKG Interpretation   None       MDM   1. Abdominal pain    Patient afebrile. Tachycardia resolved during stay in ED.  UA not consistent with UTI.  Mildly elevated Hgb and RBC  Labs otherwise appear WNL  CT shows no acute findings to explain patient's pain. Normal aorta, no aneurysm. No Kidney stones appreciated. Patient noted to have Large calcified fibroid w/in posterior myometrium.   Discussed all lab, imaging, and exam findings with patient and family. Uncertain what is causing patient's pain currently. Pain resolved with pain medication in ED. Discussed need for follow up for further evaluation with PCP if pain should continue. Reassured patient that there is low likelihood of life threatening etiology according to workup and exam. Patient agrees with plan for follow up. Resource guide  provided for follow up reference.   Meds given in ED:  Medications  HYDROcodone-acetaminophen (NORCO/VICODIN) 5-325 MG per tablet 1 tablet (1 tablet Oral Given 12/12/13 1933)    Discharge Medication List as of 12/12/2013 10:28 PM    START taking these medications   Details  HYDROcodone-acetaminophen (NORCO) 5-325 MG per tablet Take 1 tablet by mouth every 6 (six) hours as needed for moderate pain or severe pain., Starting 12/12/2013, Until Discontinued, Print            Rudene Anda, [place] 12/13/13 1413

## 2013-12-12 NOTE — Discharge Instructions (Signed)
Follow up with Primary Care/Family doctor for reevaluation in 2 days. Take pain medications as prescribed. Do not drive while taking pain medication. If your symptoms should worsen please return to ED. Refer to resource guide below for follow up reference.     Emergency Department Resource Guide 1) Find a Doctor and Pay Out of Pocket Although you won't have to find out who is covered by your insurance plan, it is a good idea to ask around and get recommendations. You will then need to call the office and see if the doctor you have chosen will accept you as a new patient and what types of options they offer for patients who are self-pay. Some doctors offer discounts or will set up payment plans for their patients who do not have insurance, but you will need to ask so you aren't surprised when you get to your appointment.  2) Contact Your Local Health Department Not all health departments have doctors that can see patients for sick visits, but many do, so it is worth a call to see if yours does. If you don't know where your local health department is, you can check in your phone book. The CDC also has a tool to help you locate your state's health department, and many state websites also have listings of all of their local health departments.  3) Find a Walk-in Clinic If your illness is not likely to be very severe or complicated, you may want to try a walk in clinic. These are popping up all over the country in pharmacies, drugstores, and shopping centers. They're usually staffed by nurse practitioners or physician assistants that have been trained to treat common illnesses and complaints. They're usually fairly quick and inexpensive. However, if you have serious medical issues or chronic medical problems, these are probably not your best option.  No Primary Care Doctor: - Call Health Connect at  [phone] - they can help you locate a primary care doctor that  accepts your insurance, provides certain  services, etc. - Physician Referral Service- [phone]  Chronic Pain Problems: Organization         Address  Phone   Notes  [facility]  [phone] Patients need to be referred by their primary care doctor.   Medication Assistance: Organization         Address  Phone   Notes  [facility] Medication [facility] [address] [place], [place] [address] [phone] --Must be a resident of [place] -- Must have NO insurance coverage whatsoever (no Medicaid/ Medicare, etc.) -- The pt. MUST have a primary care doctor that directs their care regularly and follows them in the community   MedAssist  [phone]   [employer]  [phone]    Agencies that provide inexpensive medical care: Organization         Address  Phone   Notes  [facility]  [phone]   [facility]    [phone]   [facility] [address] [place], [place] [address] [phone]   [facility] [address] [place], [place] [phone]   [facility]anned Parenthood    [phone]   [facility]    [phone]   [facility]  [address],  Phone:  [phone], Fax:  [phone] Hours of Operation:  9 am - 6 pm, M-F.  Also  accepts Medicaid/Medicare and self-pay.  [facility] for [place], [address], [place] Phone: [phone], Fax: [phone]. Hours of Operation:  8:30 am - 5:30 pm, M-F.  Also accepts Medicaid and self-pay.  [facility] [address], [place] Phone: [phone]   [place], [place], [place] [phone], Ext. [phone] Mondays & Thursdays: 7-9 AM.  First 15 patients are seen on a first come, first serve basis.    [place] Providers:  Organization         Address  Phone   Notes  [facility] [address], [place] [phone] Also accepts self-pay patients.  [facility] [id number] [place], [place]  [phone]   [place], [address], [place] [phone]   [facility] [address] [place] [phone]   Lucianne Lei [address], [place]   [phone] Only accepts [place] Access [place] patients after they have their name applied to their card.   Self-Pay (no insurance) in [facility]:  Organization         Address  Phone   Notes  Sickle Cell Patients, [facility] [place] [phone]   [facility] [place] [phone]   [facility] [place]  [place], [place], [place] [phone]   [facility]/Dr. Osei-Bonsu  [address], [place] or [address], [place] [phone] Phone number for both [place] and [place] locations is the same.  [facility] [address] [place] [phone]   [facility] [address], [place] or [address] [phone]   [facility] [address], [place] [phone], phone; [phone], fax Sees patients 1st and 3rd Saturday of every month.  Must not qualify for public or private insurance (i.e. Medicaid, Medicare, [place] Health Choice, Veterans' Benefits)  Household income should be no more than 200% of the poverty level The clinic cannot treat you if you are pregnant or think you are pregnant  Sexually transmitted diseases are not treated at the clinic.    Dental Care: Organization         Address  Phone  Notes  [facility] [phone] Accepts  children up to age 58 who are enrolled in [place] or [place]; pregnant women with a Medicaid card; and children who have applied for Medicaid or [place] Health Choice, but were declined, whose parents can pay a reduced fee at time of service.  [facility] of [facility]  [address], [place] [phone] Accepts children up to age 73 who are enrolled in [place] or [place]; pregnant women with a Medicaid card; and children who have applied for Medicaid or [place] Health Choice, but were declined, whose parents can pay a reduced fee at time of service.  [facility] Adult [facility] Access PROGRAM  [place] [phone] Patients are seen by appointment only. Walk-ins are not accepted. [place] will see patients 13 years of age and older. Monday - Tuesday (8am-5pm)  Most Wednesdays (8:30-5pm) $30 per visit, cash only  [facility] [employer] PROGRAM  [address], [facility] [phone] Patients are seen by appointment only. Walk-ins are not accepted. [place] will see patients 76 years of age and older. One Wednesday Evening (Monthly: Volunteer Based).  $30 per visit, cash only  [place]  [phone] for adults; Children under age 76, call Graduate Pediatric Dentistry at [phone]. Children aged 3-14, please call [phone] to request a pediatric application.  Dental services are provided in all areas of dental care including fillings, crowns and bridges, complete and partial dentures, implants, gum treatment, root canals, and extractions. Preventive care is also provided. Treatment is provided to both adults and children. Patients are selected via a lottery and there is often a waiting list.   [facility] [address] [place]  [phone] [email or web address]   [facility] [address] [place], [place] [phone], Ext. [phone] Second and  Fourth Thursday of each month, opens at 6:30 AM; Clinic ends at 9 AM.  Patients are seen on a first-come first-served basis, and a limited number are seen during each clinic.   [facility]  [address] Hillard Danker [place], [place] [phone]   Eligibility Requirements You must have lived in [place], [place], or [place] counties for at least the last three months.   You cannot be eligible for state or federal sponsored [employer], including [employer], [place], or [employer].   You generally cannot be eligible for healthcare insurance through your employer.    How to apply: Eligibility screenings are held every Tuesday and Wednesday afternoon from 1:00 pm until 4:00 pm. You do not need an appointment for the interview!  [facility] [address], [place], [place]   [place]  [place] Department  Industry  [phone]    Behavioral Health Resources in the Community: Intensive Outpatient Programs Organization         Address  Phone  Notes  [place] Ruth. [address], [place], [place] [phone]   [facility] Outpatient [address], [place], [place]   ADS: Alcohol & Drug Svcs [address], [place], [place]   [place] [address]  [place], [place] or [phone]   Substance Abuse Resources Organization         Address  Phone  Notes  Alcohol and Drug Services  [phone]   [place]  [phone]   The [place]   Chinita Pester  [phone]   Residential & Outpatient Substance Abuse Program  [phone]   Psychological Services Organization         Address  Phone  Notes  [facility] [place]  [place]  [phone]   [place]  [address], [place] or [phone]    Mobile Crisis Teams Organization         Address  Phone  Notes  [facility]atives, Mobile Crisis Care Unit  [phone]   [facility]ive [facility]otherapeutic Services  [address] [place], [place]   [place] Levels [address] [place] [phone]    Self-Help/Support Groups Organization         Address  Phone  Notes  Mental Health Assoc. of [place] - variety of support groups  [place] Call for more information  Narcotics Anonymous (NA), [facility]aring Services [address], [employer] [place]  2 meetings at this location   Special educational needs teacher         Address  Phone  Notes  ASAP Residential Treatment [place],    [place]  [phone]   [facility]  [address] [place] [address]2831, [place], [place]   [place], [place] [phone] Admissions: 8am-3pm M-F  Incentives Substance [place] [address]    [place], [place] [phone]   [facility] [address] [place], [place], [place]   [facility] [address],  [place], [place]   [facility]ight Programs - Intensive Outpatient [address]een [address], [place], [place]   [facility] [address]  [place], [place] [phone] or [phone]   [facility] Services (RTS) [address] [place], [place] Accepts Medicaid  [facility] [employer] [address],  [place] [phone] Substance Abuse/Addiction Treatment   [facility] Organization         Address  Phone  Notes  [facility]  [phone]   Domenic Schwab, PhD [address] Arlis Porta [place], [place]   [phone] or [phone]   [place], [place] [phone]   [facility] [address] [place], [place] [phone] Insurance/Medicaid/sponsorship through [facility] and Families [address], [address] [place]                                    [place], [place] [phone] [place] [address], [place] [phone]    Dr. Adele Schilder  [phone]   [facility] Dept. 1) [address] [place] 2) [place] 3)  [place] 65, [place] [phone]  [phone]   [place] [phone] or [phone] (After Hours)

## 2013-12-12 NOTE — ED Notes (Signed)
Pt reports lower abd pain and back pain. Denies any n/v/d or urinary symptoms at this time. No hx of kidney stones.

## 2013-12-12 NOTE — ED Notes (Signed)
No answer

## 2013-12-13 NOTE — ED Provider Notes (Signed)
Medical screening examination/treatment/procedure(s) were performed by non-physician practitioner and as supervising physician I was immediately available for consultation/collaboration.     Geoffery Lyonsouglas Reynaldo Rossman, MD 12/13/13 2249

## 2014-09-24 ENCOUNTER — Ambulatory Visit: Payer: BC Managed Care – HMO | Admitting: Internal Medicine

## 2014-10-05 ENCOUNTER — Telehealth: Payer: Self-pay | Admitting: Internal Medicine

## 2014-10-05 ENCOUNTER — Other Ambulatory Visit: Payer: Self-pay

## 2014-10-05 MED ORDER — FLUTICASONE-SALMETEROL 250-50 MCG/DOSE IN AEPB: 1.0000 | INHALATION_SPRAY | Freq: Two times a day (BID) | RESPIRATORY_TRACT | Status: DC

## 2014-10-05 NOTE — Telephone Encounter (Signed)
[facility]  Called and left msg with spouse to make her aware this was done  Nothing further needed

## 2014-10-07 ENCOUNTER — Other Ambulatory Visit: Payer: Self-pay | Admitting: *Deleted

## 2014-12-03 ENCOUNTER — Ambulatory Visit (INDEPENDENT_AMBULATORY_CARE_PROVIDER_SITE_OTHER): Payer: BLUE CROSS/BLUE SHIELD | Admitting: Internal Medicine

## 2014-12-03 ENCOUNTER — Encounter: Payer: Self-pay | Admitting: Internal Medicine

## 2014-12-03 VITALS — BP 116/76 | HR 95 | Ht 64.0 in | Wt 157.4 lb

## 2014-12-03 DIAGNOSIS — J302 Other seasonal allergic rhinitis: Secondary | ICD-10-CM

## 2014-12-03 DIAGNOSIS — J3089 Other allergic rhinitis: Principal | ICD-10-CM

## 2014-12-03 DIAGNOSIS — J309 Allergic rhinitis, unspecified: Secondary | ICD-10-CM

## 2014-12-03 DIAGNOSIS — J4520 Mild intermittent asthma, uncomplicated: Secondary | ICD-10-CM

## 2014-12-03 MED ORDER — MOMETASONE FURO-FORMOTEROL FUM 100-5 MCG/ACT IN AERO: INHALATION_SPRAY | RESPIRATORY_TRACT | Status: DC

## 2014-12-03 NOTE — Patient Instructions (Signed)
Advair is no longer preferred by your insurance, so it will cost you more.  Try sample and prescription for Dulera 100 inhaler  Instead of Advair           Inhale 2 puffs, then rinse mouth. Do this twice daily- once in the morning and once at night

## 2014-12-03 NOTE — Progress Notes (Signed)
01/22/12- 56 yoF from [place], followed for asthmatic bronchitis, allergic rhinitis. History of ventilator dependent respiratory failure. LOV- 12/22/2010 Was in [place] last month. Here now with youngest daughter. Reports occasional mild wheezing, easily controlled and mostly associated with colds. Occasional rhinitis is also better controlled. Has been using Benadryl as antihistamine of choice but it does cause sleepiness.  09/24/13-  57 yoF from [place], followed for asthmatic bronchitis, allergic rhinitis. History of ventilator dependent respiratory failure. FOLLOWS FOR: Breathing is unchanged. Reports chest tightness is improved. Having issues with runny nose and congestion. Watery eyes and nose especially in the evening, starting before she lies down. 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 Benadryl makes her too sleepy, using cetirizine. Daughter is a marine, husband is an Tree surgeonartist.  12/03/14- [age] yoF from [place], followed for asthmatic bronchitis, allergic rhinitis. History of ventilator dependent respiratory failure. FOLLOWS FOR: Pt states she feels better but has noticed tightness in upper back and neck area. Pt also states she has cough and wheezing at times as well. Going to a gym for exercise now. CXR 1V- 11/30/13 IMPRESSION:  1. Peribronchial thickening, as described.  2. No active disease. Provider: Arlice Coltara Lawrence, Alfonse FlavorsNicole Pilkenton  ROS-see HPI Constitutional:   No-   weight loss, night sweats, fevers, chills, fatigue, lassitude. HEENT:   No-  headaches, difficulty swallowing, tooth/dental problems, sore throat,       No-  sneezing, itching, ear ache, nasal congestion, post nasal drip,  CV:  No-   chest pain, orthopnea, PND, swelling in lower extremities, anasarca, dizziness, palpitations Resp: No-   shortness of breath with exertion or at rest.              No-   productive cough,  No non-productive cough,  No- coughing up of blood.              No-   change in color of mucus.  + wheezing.   Skin:  No-   rash or lesions. GI:  No-   heartburn, indigestion, abdominal pain, nausea,  GU:. MS:  No-   joint pain or swelling.   Neuro-     nothing unusual Psych:  No- change in mood or affect. No depression or anxiety.  No memory loss.  OBJ- Physical Exam General- Alert, Oriented, Affect-appropriate, Distress- none acute Skin- rash-none, lesions- none, excoriation- none Lymphadenopathy- none Head- atraumatic            Eyes- Gross vision intact, PERRLA, conjunctivae and secretions clear            Ears- Hearing, canals-normal            Nose- Clear, no-Septal dev, mucus, polyps, erosion, perforation             Throat- Mallampati II , mucosa clear , drainage- none, tonsils- atrophic Neck- flexible , trachea midline, no stridor , thyroid nl, carotid no bruit Chest - symmetrical excursion , unlabored           Heart/CV- RRR , no murmur , no gallop  , no rub, nl s1 s2                           - JVD- none , edema- none, stasis changes- none, varices- none           Lung- clear to P&A, wheeze- none, cough- none , dullness-none, rub- none           Chest wall-  Abd- Br/  Gen/ Rectal- Not done, not indicated Extrem- cyanosis- none, clubbing, none, atrophy- none, strength- nl Neuro- grossly intact to observation

## 2014-12-06 NOTE — Assessment & Plan Note (Signed)
Mild intermittent asthma, uncomplicated, well controlled 

## 2014-12-06 NOTE — Assessment & Plan Note (Signed)
Very well outside pollen season with no recent upper respiratory infection

## 2015-11-17 ENCOUNTER — Telehealth: Payer: Self-pay | Admitting: Internal Medicine

## 2015-11-17 NOTE — Telephone Encounter (Signed)
Last ov with CY on 12/03/14  Called and spoke with pt. Pt would like a refill on her dulera. She is going out of the country on 11[place]22/15 and is requesting 2-3 inhalers. She states she will not return until March 2017. I explained to her that she had an ov on 12/09/14 and she stated that she was unable to make that ov. I explained to her that I could not refill her meds until she was seen by CY for her yearly follow up. I scheduled pt for ov with CY on 11/18/15 at 11:15. Nothing further needed. Will cancel ov on 12/10/15.

## 2015-11-18 ENCOUNTER — Ambulatory Visit (INDEPENDENT_AMBULATORY_CARE_PROVIDER_SITE_OTHER): Payer: BLUE CROSS/BLUE SHIELD | Admitting: Internal Medicine

## 2015-11-18 ENCOUNTER — Ambulatory Visit (INDEPENDENT_AMBULATORY_CARE_PROVIDER_SITE_OTHER)
Admission: RE | Admit: 2015-11-18 | Discharge: 2015-11-18 | Disposition: A | Discharge status: Home / Self Care | Payer: BLUE CROSS/BLUE SHIELD | Source: Ambulatory Visit | Attending: Internal Medicine | Admitting: Internal Medicine

## 2015-11-18 ENCOUNTER — Encounter: Payer: Self-pay | Admitting: Internal Medicine

## 2015-11-18 VITALS — BP 128/70 | HR 106 | Ht 64.0 in | Wt 155.6 lb

## 2015-11-18 DIAGNOSIS — J4541 Moderate persistent asthma with (acute) exacerbation: Secondary | ICD-10-CM

## 2015-11-18 DIAGNOSIS — J4540 Moderate persistent asthma, uncomplicated: Secondary | ICD-10-CM

## 2015-11-18 MED ORDER — LEVALBUTEROL HCL 0.63 MG/3ML IN NEBU
0.6300 mg | INHALATION_SOLUTION | Freq: Once | RESPIRATORY_TRACT | Status: AC
  Administered 2015-11-18: 0.63 mg via RESPIRATORY_TRACT

## 2015-11-18 MED ORDER — METHYLPREDNISOLONE ACETATE 80 MG/ML IJ SUSP
80.0000 mg | Freq: Once | INTRAMUSCULAR | Status: AC
  Administered 2015-11-18: 80 mg via INTRAMUSCULAR

## 2015-11-18 MED ORDER — MOMETASONE FURO-FORMOTEROL FUM 100-5 MCG/ACT IN AERO: INHALATION_SPRAY | RESPIRATORY_TRACT | Status: DC

## 2015-11-18 NOTE — Progress Notes (Signed)
01/22/12- 56 yoF from [place], followed for asthmatic bronchitis, allergic rhinitis. History of ventilator dependent respiratory failure. LOV- 12/22/2010 Was in [place] last month. Here now with youngest daughter. Reports occasional mild wheezing, easily controlled and mostly associated with colds. Occasional rhinitis is also better controlled. Has been using Benadryl as antihistamine of choice but it does cause sleepiness.  09/24/13-  57 yoF from [place], followed for asthmatic bronchitis, allergic rhinitis. History of ventilator dependent respiratory failure. FOLLOWS FOR: Breathing is unchanged. Reports chest tightness is improved. Having issues with runny nose and congestion. Watery eyes and nose especially in the evening, starting before she lies down. 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 Benadryl makes her too sleepy, using cetirizine. Daughter is a marine, husband is an Tree surgeonartist.  12/03/14- [age] yoF from [place], followed for asthmatic bronchitis, allergic rhinitis. History of ventilator dependent respiratory failure. FOLLOWS FOR: Pt states she feels better but has noticed tightness in upper back and neck area. Pt also states she has cough and wheezing at times as well. Going to a gym for exercise now. CXR 1V- 11/30/13 IMPRESSION:  1. Peribronchial thickening, as described.  2. No active disease. Provider: Arlice Coltara Lawrence, Alfonse Flavorsicole Pilkenton  11/18/2015-59 year old female from [place], followed for asthmatic bronchitis, allergic rhinitis. History of ventilator-dependent respiratory failure. FOLLOWS FOR: pt. states overall doing well. had a prod. cough clear in color x5d. after taking dulera cough improved. no congestion or drainage  Declines flu vaccine saying it made her very sick. Going back to [place] for 3 months for a family wedding Says she is feeling well but admits wheeze.  ROS-see HPI Constitutional:   No-   weight loss, night sweats, fevers, chills, fatigue, lassitude. HEENT:   No-  headaches,  difficulty swallowing, tooth/dental problems, sore throat,       No-  sneezing, itching, ear ache, nasal congestion, post nasal drip,  CV:  No-   chest pain, orthopnea, PND, swelling in lower extremities, anasarca, dizziness, palpitations Resp: No-   shortness of breath with exertion or at rest.              No-   productive cough,  No non-productive cough,  No- coughing up of blood.              No-   change in color of mucus.  + wheezing.   Skin: No-   rash or lesions. GI:  No-   heartburn, indigestion, abdominal pain, nausea,  GU:. MS:  No-   joint pain or swelling.   Neuro-     nothing unusual Psych:  No- change in mood or affect. No depression or anxiety.  No memory loss.  OBJ- Physical Exam General- Alert, Oriented, Affect-appropriate, Distress- none acute Skin- rash-none, lesions- none, excoriation- none Lymphadenopathy- none Head- atraumatic            Eyes- Gross vision intact, PERRLA, conjunctivae and secretions clear            Ears- Hearing, canals-normal            Nose- Clear, no-Septal dev, mucus, polyps, erosion, perforation             Throat- Mallampati II , mucosa clear , drainage- none, tonsils- atrophic Neck- flexible , trachea midline, no stridor , thyroid nl, carotid no bruit Chest - symmetrical excursion , unlabored           Heart/CV- RRR , no murmur , no gallop  , no rub, nl s1 s2                           -  JVD- none , edema- none, stasis changes- none, varices- none           Lung- clear to P&A, wheeze + unlabored, cough- none , dullness-none, rub- none           Chest wall-  Abd- Br/ Gen/ Rectal- Not done, not indicated Extrem- cyanosis- none, clubbing, none, atrophy- none, strength- nl Neuro- grossly intact to observation

## 2015-11-18 NOTE — Patient Instructions (Signed)
Neb xop 0.63  Depo 80  [facility]  Order- CXR    Dx Asthmatic bronchitis moderate persistent

## 2015-11-22 NOTE — Assessment & Plan Note (Signed)
She is used to wheezing and barely aware of it. Medications discussed. Plan-refill Dulera. CXR. Nebulizer Xopenex, Depo-Medrol

## 2015-12-10 ENCOUNTER — Ambulatory Visit: Payer: BLUE CROSS/BLUE SHIELD | Admitting: Internal Medicine

## 2016-05-18 ENCOUNTER — Ambulatory Visit: Payer: BLUE CROSS/BLUE SHIELD | Admitting: Internal Medicine

## 2016-06-18 IMAGING — DX DG CHEST 2V
2 series · 2 of 2 positions shown · non-contrast
Comparison: 10/04/2004

CLINICAL DATA: Cough, congestion for 5 days

EXAM:
CHEST  2 VIEW

[chest pa]
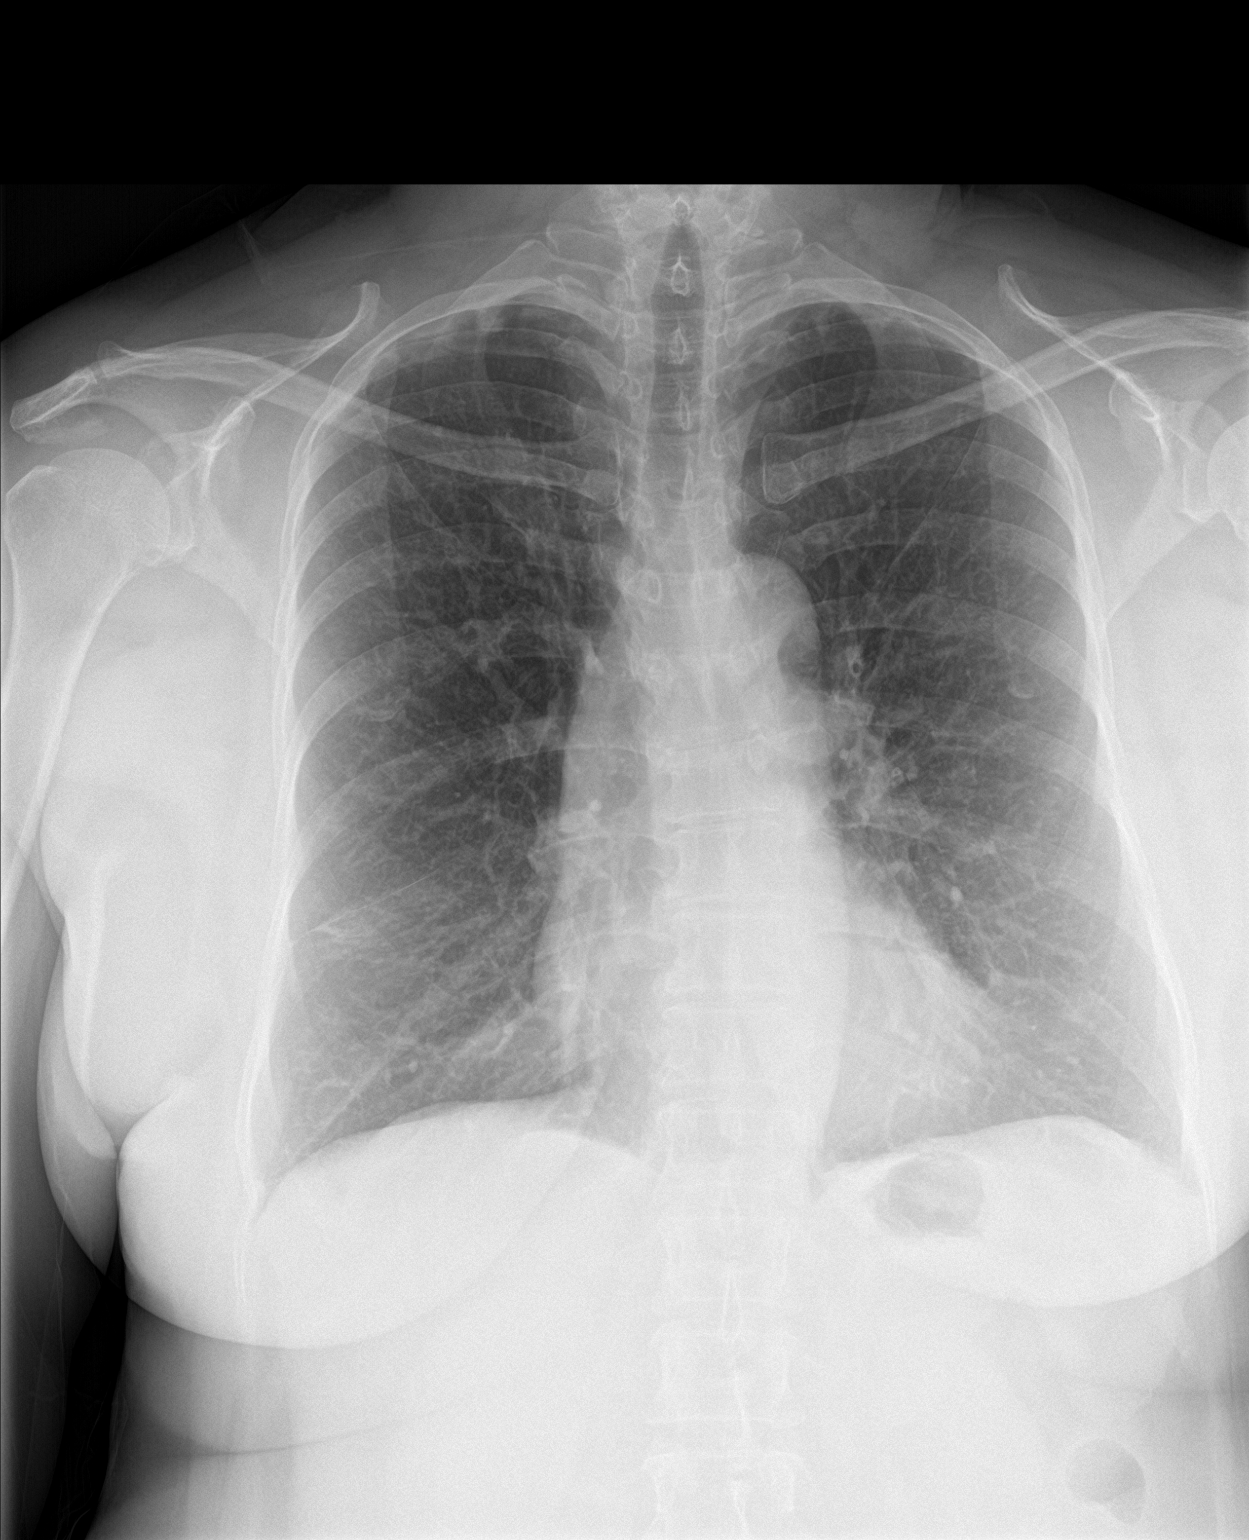

[chest lat]
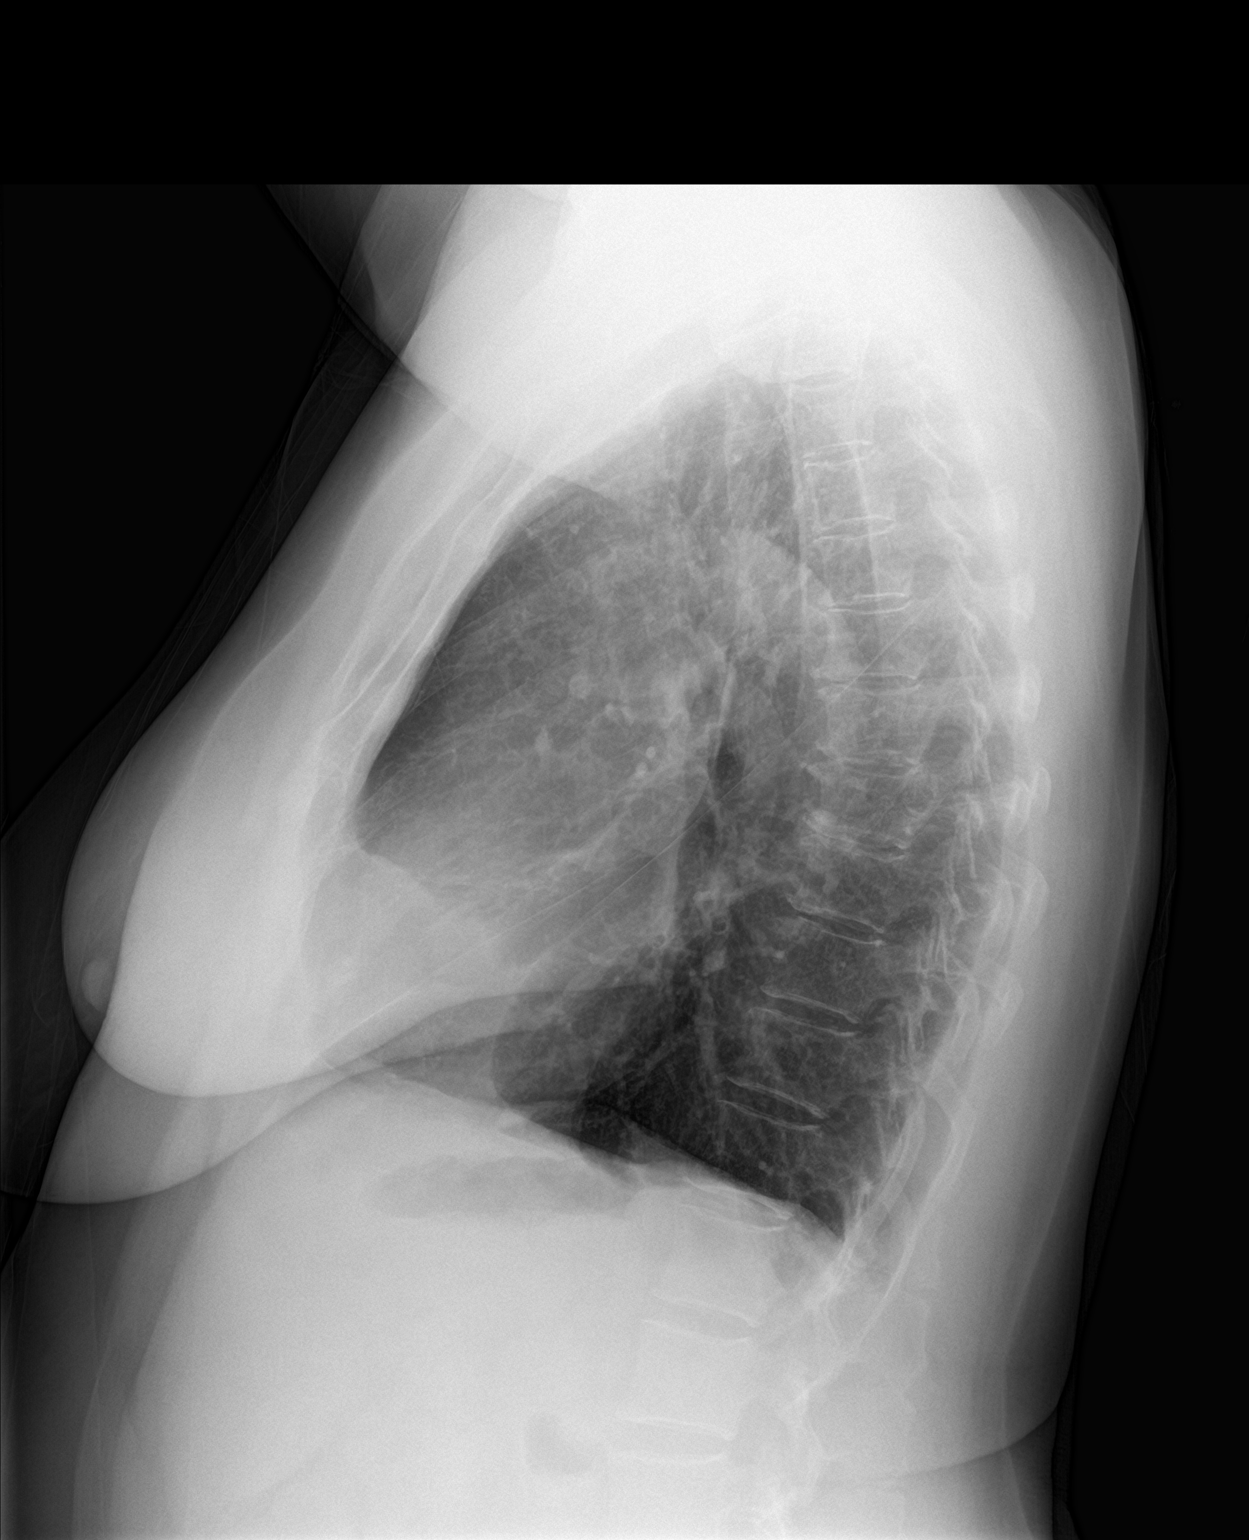

[2 of 2 positions shown; findings below may reference images not displayed]

FINDINGS: There is hyperinflation of the lungs compatible with COPD. Biapical
scarring. Lungs otherwise clear. No effusions. Heart is normal size.
No acute bony abnormality.
IMPRESSION: COPD.  Biapical scarring.  No acute findings.

## 2016-07-18 ENCOUNTER — Telehealth: Payer: Self-pay | Admitting: Internal Medicine

## 2016-07-18 NOTE — Telephone Encounter (Signed)
ATC -VM not set up - WCB 

## 2016-07-18 NOTE — Telephone Encounter (Signed)
ATC line busy WCB 

## 2016-07-19 MED ORDER — MOMETASONE FURO-FORMOTEROL FUM 100-5 MCG/ACT IN AERO: INHALATION_SPRAY | RESPIRATORY_TRACT | 2 refills | Status: AC

## 2016-07-19 NOTE — Telephone Encounter (Signed)
Spoke with pt. She is moving to [place] and was wondering if CY knew of any good doctors in [place]. Pt is also needing a refill on Dulera. This has been sent in.  CY - please advise if you know of any doctors in [place]. Thanks.

## 2016-07-20 NOTE — Telephone Encounter (Signed)
I do not know any doctors in [place]. I usually suggest she establish a primary care doc in new town and ask that person for referrals as needed.

## 2016-07-20 NOTE — Telephone Encounter (Signed)
Spoke with pt. She is aware of CY's recommendation. Nothing further was needed. 

## 2019-07-01 ENCOUNTER — Ambulatory Visit: Payer: BLUE CROSS/BLUE SHIELD | Attending: Ophthalmology | Admitting: Ophthalmology

## 2019-07-01 ENCOUNTER — Encounter: Payer: Self-pay | Admitting: Ophthalmology

## 2019-07-01 DIAGNOSIS — H59812 Chorioretinal scars after surgery for detachment, left eye: Secondary | ICD-10-CM

## 2019-07-01 DIAGNOSIS — Z0100 Encounter for examination of eyes and vision without abnormal findings: Secondary | ICD-10-CM | POA: Insufficient documentation

## 2019-07-01 DIAGNOSIS — H538 Other visual disturbances: Secondary | ICD-10-CM | POA: Insufficient documentation

## 2019-07-01 DIAGNOSIS — H02886 Meibomian gland dysfunction of left eye, unspecified eyelid: Secondary | ICD-10-CM | POA: Insufficient documentation

## 2019-07-01 DIAGNOSIS — H04123 Dry eye syndrome of bilateral lacrimal glands: Secondary | ICD-10-CM | POA: Insufficient documentation

## 2019-07-01 DIAGNOSIS — H26491 Other secondary cataract, right eye: Secondary | ICD-10-CM | POA: Insufficient documentation

## 2019-07-01 DIAGNOSIS — H02883 Meibomian gland dysfunction of right eye, unspecified eyelid: Secondary | ICD-10-CM | POA: Insufficient documentation

## 2019-07-01 MED ORDER — ADVAIR DISKUS IN: RESPIRATORY_TRACT | Status: AC

## 2019-07-01 NOTE — Progress Notes (Signed)
Pt here to establish care, referred by Dr. Vallarie Mare- was in [facility] and moved here 3 years ago.    Had CEIOL OU in [place], then laser (?YAG), then PPV with SO placement OS in 2017 in [place] for RD followed by SO removal in [place]. Reports vision stable OU.    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 At the current time there is no irreversible damage. We explained that this is a chronic condition and frequently a combination of both aqueous deficiency and surface/eyelid disease. The patient understands that there is no cure for ocular surface disease, and that treatment is chronic in nature. We reviewed several options including preserved artificial tears, non-preserved artificial tears, gels/ointments, omega 3 fatty acid supplementation, Restasis, Xiidra, topical steroids, autologous serum drops, warm compresses with lid massage/wash, hypochlorous acid, and Lipiflow. At this time the treatment plan will consist of:    Multiple risk factors     NPAT: start 4x/day  Gel/ointment: consider  Omega 3 FA: consider  Restasis: consider  Xiidra: consider  Steroids: consider  Lid hygiene: start BID  Hypochlor: consider  BlephEX: consider  Lipiflow: consider       The patient understands that compliance to the treatment plan is essential for improvement of their condition/symptoms. The patient also understands that it will take time for their symptoms to improve.  Consistent and proactive treatment was reviewed with the patient.      RTC next available retina, 3-4 months for YAG OD    MONOCULAR PRECAUTIONS REVIEWED      Caprice Red, MD  [facility] Fellow     I reviewed and confirmed all history components, including the HPI; and any other elements  performed by the technician. I participated in the key and critical portions of the exam.  The resident/fellow and I discussed and jointly formulated the above assessment and plan, and, any  image interpretations.    Shaquela Weichert (Sam) Cathren Laine MD  Associate Professor of Ophthalmology  Cornea, Cataract, and Refractive Surgery

## 2019-07-15 ENCOUNTER — Ambulatory Visit: Payer: BLUE CROSS/BLUE SHIELD | Attending: Ophthalmology | Admitting: Ophthalmology

## 2019-07-15 ENCOUNTER — Encounter: Payer: Self-pay | Admitting: Ophthalmology

## 2019-07-15 DIAGNOSIS — H04123 Dry eye syndrome of bilateral lacrimal glands: Secondary | ICD-10-CM

## 2019-07-15 DIAGNOSIS — H538 Other visual disturbances: Secondary | ICD-10-CM | POA: Insufficient documentation

## 2019-07-15 DIAGNOSIS — Z0100 Encounter for examination of eyes and vision without abnormal findings: Secondary | ICD-10-CM | POA: Insufficient documentation

## 2019-07-15 DIAGNOSIS — H35372 Puckering of macula, left eye: Secondary | ICD-10-CM | POA: Insufficient documentation

## 2019-07-15 DIAGNOSIS — H59812 Chorioretinal scars after surgery for detachment, left eye: Secondary | ICD-10-CM | POA: Insufficient documentation

## 2019-07-15 DIAGNOSIS — H26491 Other secondary cataract, right eye: Secondary | ICD-10-CM | POA: Insufficient documentation

## 2019-07-15 NOTE — Progress Notes (Signed)
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 Epiretinal membrane (ERM) of left eye  H35.372 362.56   3. Blurry vision, left eye  H53.8 368.8   4. Dry eyes, bilateral  H04.123 375.15   5. 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 Epiretinal membrane (ERM) of left eye  3. Blurry vision, left eye  Patient referred by Dr. Cathren Laine (previously moved from [facility] 3 years ago)  Hx/ of RD OS in [place] in 2017 with PPV and SO s/p removal of SO in [place]  Patient reports one injection OS 2 years ago  Reports some improvement in vision over time, today CF 3'  Appears to have 360 buckle and peripheral laser with fibrosis, no RT - stable  Given appearance of outer retinal atrophy and ellipsoid zone, do not recommend further surgery or macular peeling    Plan:  Observation    4. Dry eyes, bilateral  Followed by Dr. Cathren Laine, CPM    5. Posterior capsular opacification, right  Will undergo planned YAG Cap next visit with Dr. Cathren Laine  Obs    F/U 6 months w/ OCT macula        I reviewed and confirmed all history components, including the HPI; and any other elements performed by the technician. I participated in the key and critical portions of the exam. The resident/fellow and I discussed and jointly formulated the above assessment and plan, and, any image interpretations.     Jaylee Freeze C. Murvin Natal, MD

## 2019-08-01 ENCOUNTER — Ambulatory Visit (INDEPENDENT_AMBULATORY_CARE_PROVIDER_SITE_OTHER): Payer: Self-pay | Admitting: Optometrist

## 2019-08-01 NOTE — Progress Notes (Signed)
The patient is a 63 year old who sees Drs Cathren Laine and Murvin Natal for eye issues which limit her BVA of the OS (irregular pupil, Hx of RD).  Her BVA for the OD is obtained with the following:   Ophthalmology Exam     Final Rx       Sphere Cylinder Axis Dist VA Add Near [place]    Right -1.25 +0.75 160 20/20- +2.50 20/20-    Left Balance   HM      Type: Bifocal or PAL    Comments: Anti-reflective, Polycarbonate  +1.25 intermed add          Edited by: Michel Santee          She really likes this Rx and should do well with it. POLYCARBONATE Lenses a must  (for safety purposes). She will continue with Drs Cathren Laine and Murvin Natal for OMD eyecare.

## 2019-09-30 ENCOUNTER — Ambulatory Visit: Payer: BLUE CROSS/BLUE SHIELD | Admitting: Ophthalmology

## 2020-01-12 NOTE — Progress Notes (Deleted)
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 Epiretinal membrane (ERM) of left eye  H35.372 362.56   3. Blurry vision, left eye  H53.8 368.8       1. 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 Epiretinal membrane (ERM) of left eye  3. Blurry vision, left eye  Patient referred by Dr. 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 Dry eyes, bilateral  Followed by Dr. Eben Burow, CPM    5. Posterior capsular opacification, right  Will undergo planned YAG Cap next visit with Dr. Eben Burow  Obs    F/U 6 months w/ OCT macula        I reviewed and confirmed all history components, including the HPI; and any other elements performed by the technician. I participated in the key and critical portions of the exam. The resident/fellow and I discussed and jointly formulated the above assessment and plan, and, any image interpretations.     Mitul C. Bufford Buttner, MD

## 2020-01-13 ENCOUNTER — Ambulatory Visit: Payer: BLUE CROSS/BLUE SHIELD | Admitting: Ophthalmology

## 2022-05-17 ENCOUNTER — Telehealth: Payer: Self-pay | Admitting: Internal Medicine

## 2022-05-17 NOTE — Telephone Encounter (Signed)
Received a fax request from [facility], [place] social services requesting medical records.  Called the office and left VM with phone and fax numbers for [facility] Records.  Patient last seen at [facility] in Dec 2016.
